# Patient Record
Sex: Male | Born: 1975
Health system: Southern US, Community
[De-identification: ages and names within clinical notes are randomized; demographics above are authoritative.]

---

## 2010-01-20 ENCOUNTER — Emergency Department (HOSPITAL_COMMUNITY): Admission: EM | Admit: 2010-01-20 | Discharge: 2010-01-20 | Payer: Self-pay | Admitting: Emergency Medicine

## 2010-05-31 ENCOUNTER — Ambulatory Visit: Payer: Self-pay | Admitting: Internal Medicine

## 2010-07-05 ENCOUNTER — Encounter (INDEPENDENT_AMBULATORY_CARE_PROVIDER_SITE_OTHER): Payer: Self-pay | Admitting: Internal Medicine

## 2010-07-05 LAB — CONVERTED CEMR LAB
ALT: 11 units/L (ref 0–53)
ANA Titer 1: 1:640 {titer} — ABNORMAL HIGH
AST: 21 units/L (ref 0–37)
Alkaline Phosphatase: 57 units/L (ref 39–117)
Creatinine, Ser: 0.95 mg/dL (ref 0.40–1.50)
Glucose, Bld: 73 mg/dL (ref 70–99)
Total Bilirubin: 0.6 mg/dL (ref 0.3–1.2)

## 2010-08-04 ENCOUNTER — Encounter: Payer: Self-pay | Admitting: Internal Medicine

## 2010-08-04 LAB — CONVERTED CEMR LAB
ANA Titer 1: 1:2560 {titer} — ABNORMAL HIGH
Basophils Relative: 2 % — ABNORMAL HIGH (ref 0–1)
Bilirubin Urine: NEGATIVE
ENA SM Ab Ser-aCnc: 5 (ref <30)
Eosinophils Absolute: 0.4 10*3/uL (ref 0.0–0.7)
Eosinophils Relative: 5 % (ref 0–5)
HCT: 42.5 % (ref 39.0–52.0)
Hemoglobin: 14 g/dL (ref 13.0–17.0)
Ketones, ur: NEGATIVE mg/dL
Lymphocytes Relative: 30 % (ref 12–46)
MCV: 81.1 fL (ref 78.0–100.0)
Monocytes Relative: 14 % — ABNORMAL HIGH (ref 3–12)
Neutro Abs: 3.5 10*3/uL (ref 1.7–7.7)
Nitrite: NEGATIVE
RBC: 5.24 M/uL (ref 4.22–5.81)
Specific Gravity, Urine: 1.008 (ref 1.005–1.030)
Squamous Epithelial / LPF: NONE SEEN /lpf
Urine Glucose: NEGATIVE mg/dL
pH: 7 (ref 5.0–8.0)

## 2010-09-19 ENCOUNTER — Encounter
Admission: RE | Admit: 2010-09-19 | Discharge: 2010-09-19 | Payer: Self-pay | Source: Home / Self Care | Attending: Family Medicine | Admitting: Family Medicine

## 2010-10-11 ENCOUNTER — Emergency Department (HOSPITAL_COMMUNITY)
Admission: EM | Admit: 2010-10-11 | Discharge: 2010-10-11 | Disposition: A | Discharge status: Home / Self Care | Payer: Worker's Compensation | Attending: Emergency Medicine | Admitting: Emergency Medicine

## 2010-10-11 ENCOUNTER — Emergency Department (HOSPITAL_COMMUNITY): Payer: Worker's Compensation

## 2010-10-11 DIAGNOSIS — S6000XA Contusion of unspecified finger without damage to nail, initial encounter: Secondary | ICD-10-CM | POA: Insufficient documentation

## 2010-10-11 DIAGNOSIS — Y929 Unspecified place or not applicable: Secondary | ICD-10-CM | POA: Insufficient documentation

## 2010-10-11 DIAGNOSIS — M7989 Other specified soft tissue disorders: Secondary | ICD-10-CM | POA: Insufficient documentation

## 2010-10-11 DIAGNOSIS — S62639A Displaced fracture of distal phalanx of unspecified finger, initial encounter for closed fracture: Secondary | ICD-10-CM | POA: Insufficient documentation

## 2010-10-11 DIAGNOSIS — M79609 Pain in unspecified limb: Secondary | ICD-10-CM | POA: Insufficient documentation

## 2010-10-11 DIAGNOSIS — W230XXA Caught, crushed, jammed, or pinched between moving objects, initial encounter: Secondary | ICD-10-CM | POA: Insufficient documentation

## 2012-07-31 IMAGING — CR DG FINGER THUMB 2+V*L*
4 series · 4 of 4 positions shown · non-contrast
Comparison: None.

CLINICAL DATA: Crush injury to distal thumb

LEFT THUMB 2+V

[x finger pa left *]
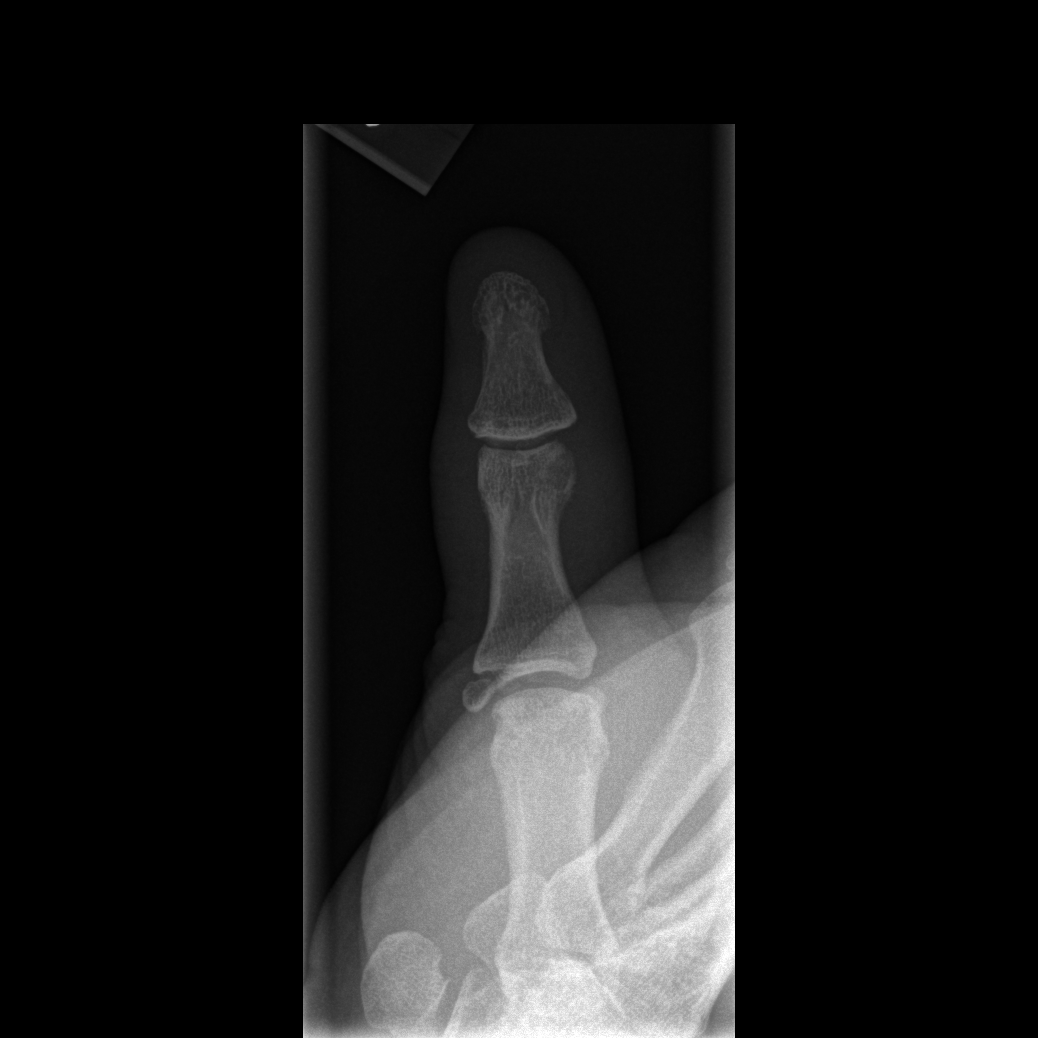

[x finger pa left]
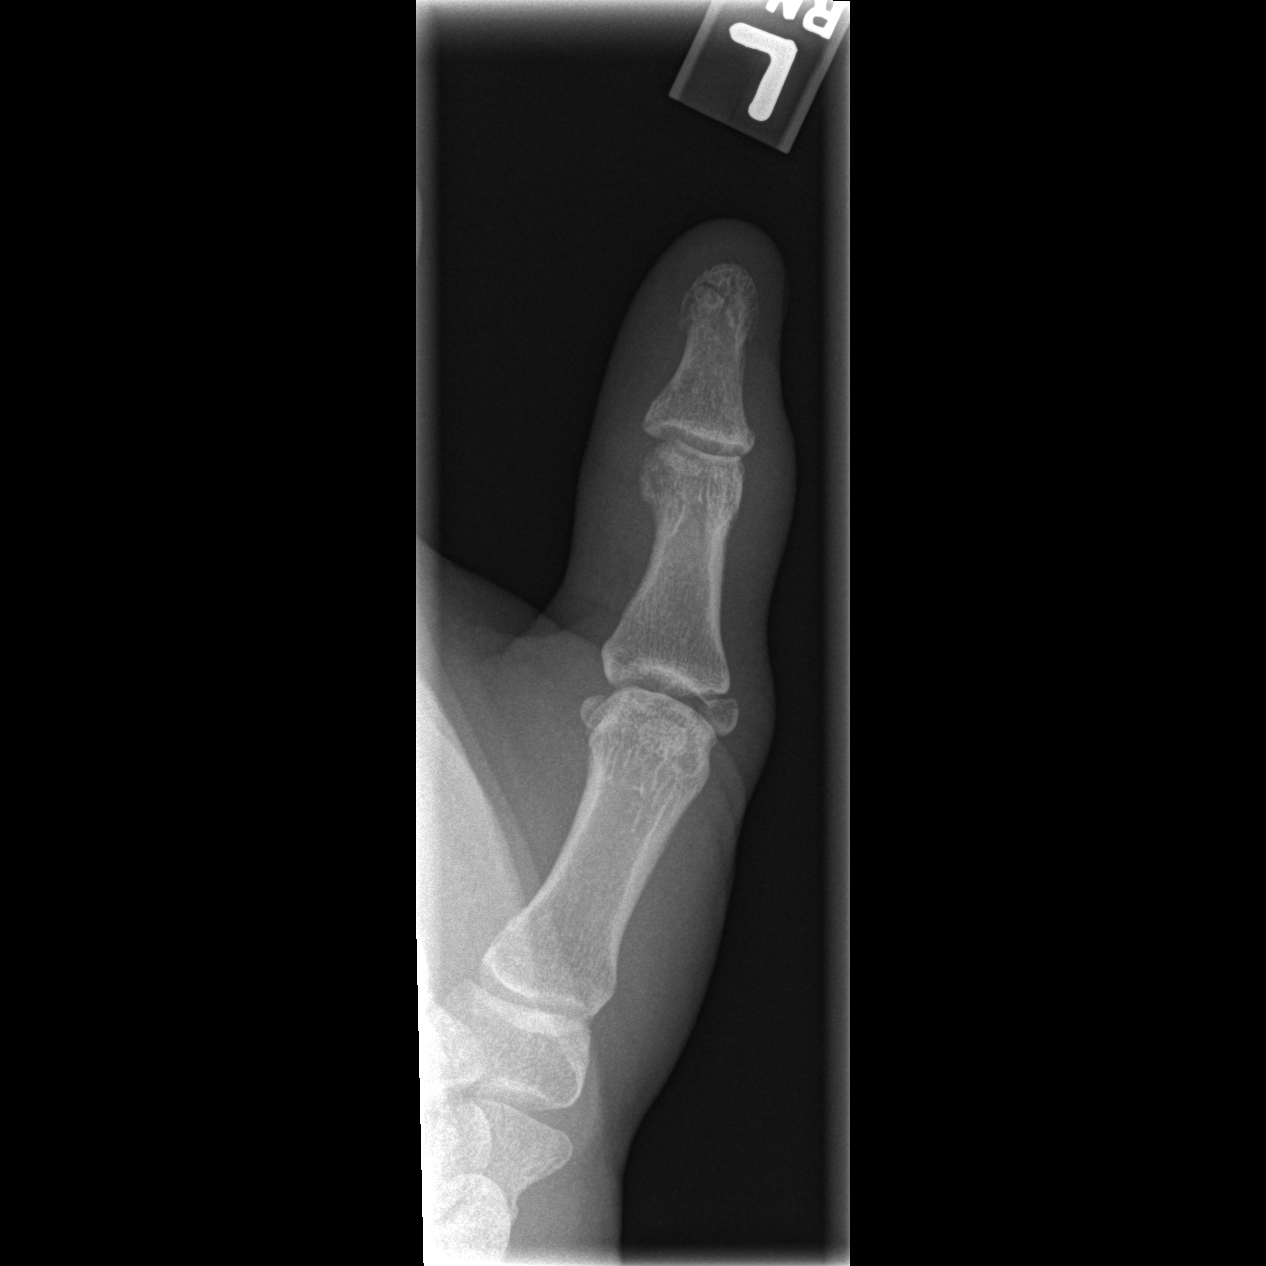

[x finger obl. left]
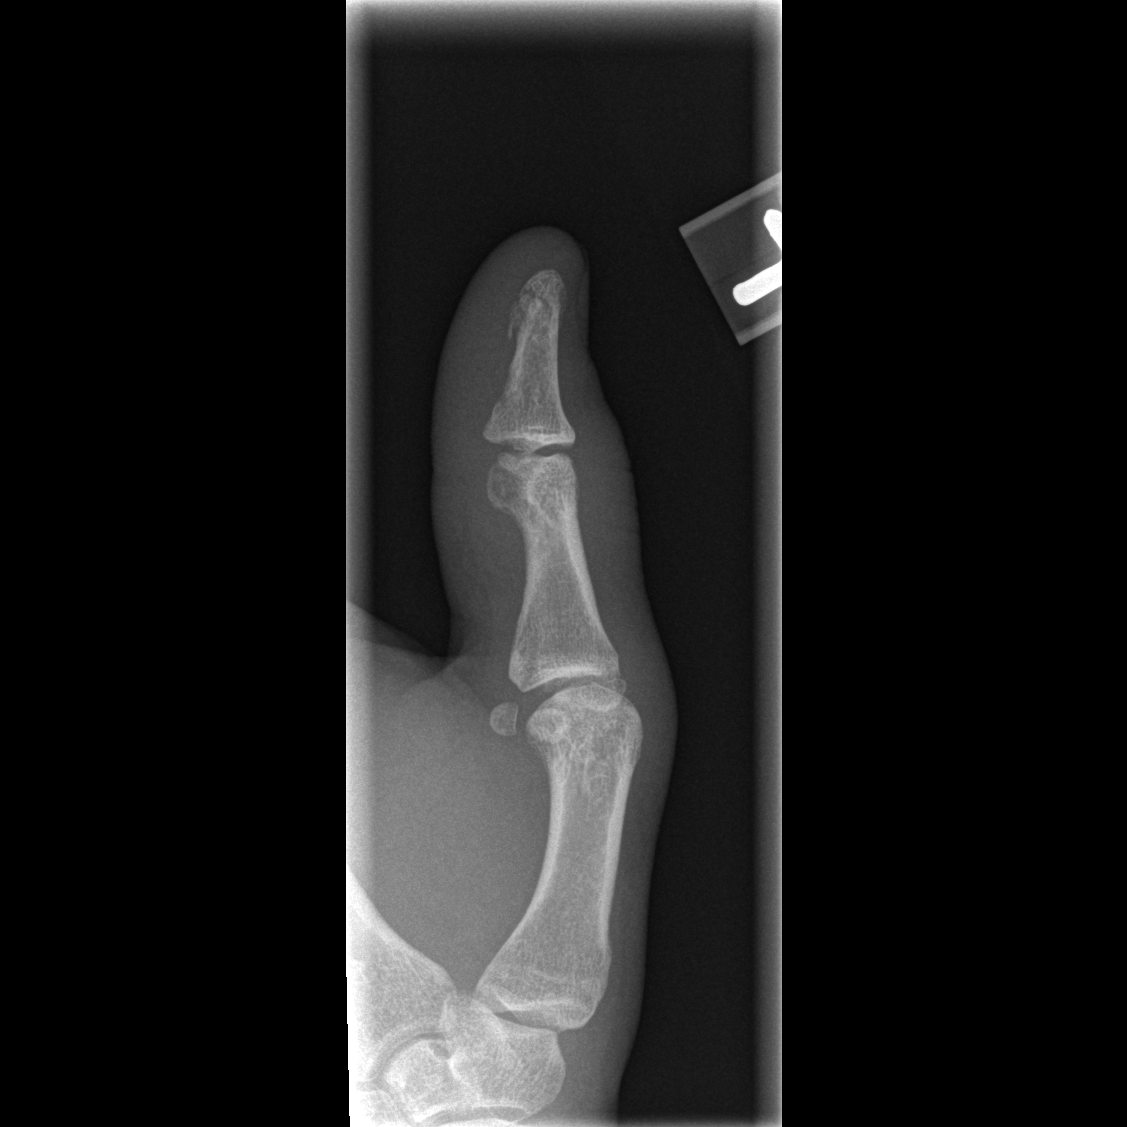

[x finger lateral left]
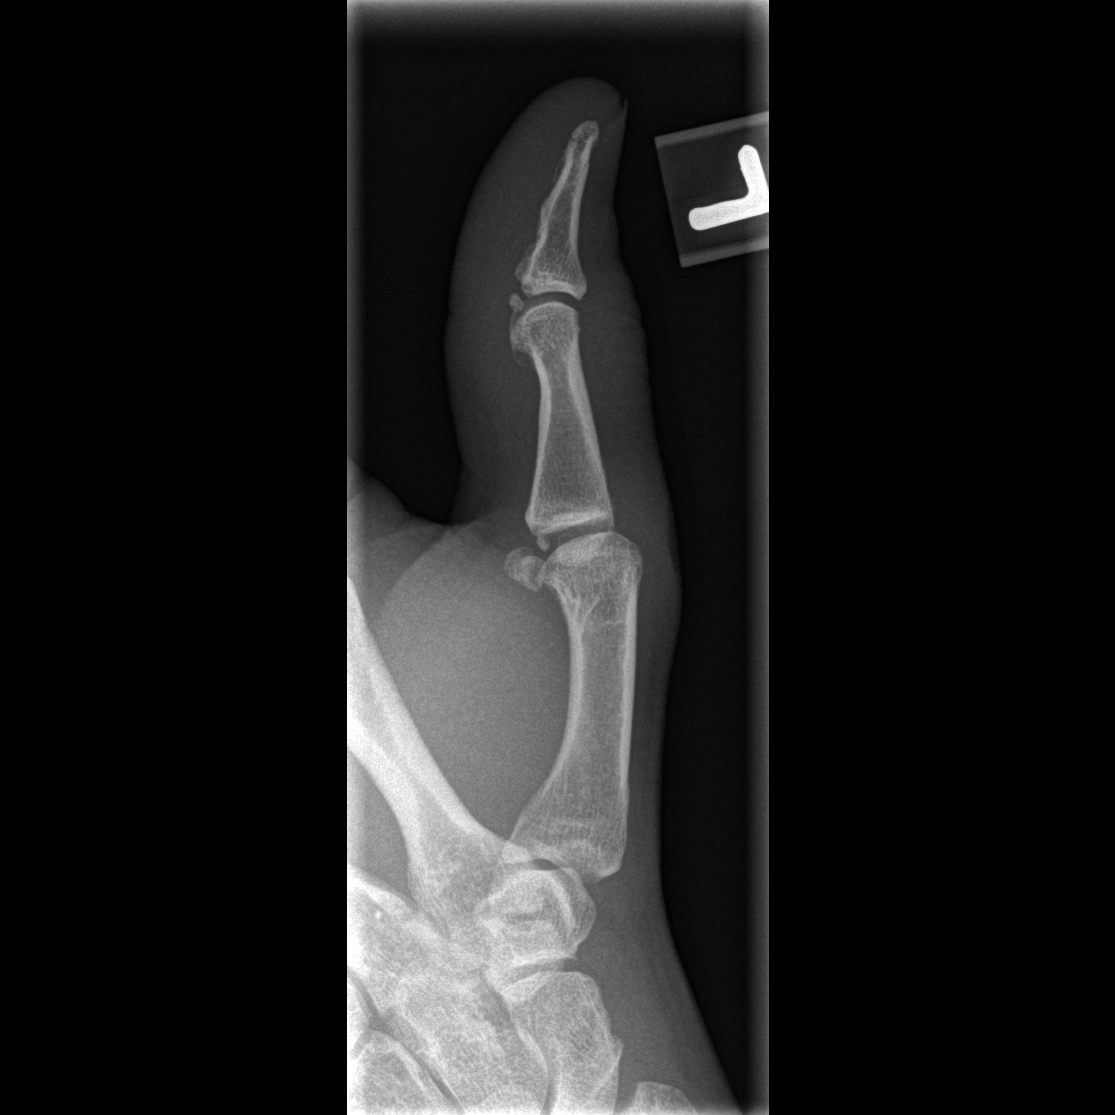

[4 of 4 positions shown; findings below may reference images not displayed]

FINDINGS: There is a nondisplaced fracture of the terminal tuft of
the distal phalanx.  There is soft tissue swelling around the
interphalangeal joint.  There is an accessory ossicle emanating off
the base of the proximal phalanx that simulates a fracture.
IMPRESSION: Terminal tuft fracture.

## 2012-11-19 ENCOUNTER — Emergency Department (HOSPITAL_COMMUNITY)
Admission: EM | Admit: 2012-11-19 | Discharge: 2012-11-19 | Disposition: A | Discharge status: Home / Self Care | Payer: Self-pay | Attending: Emergency Medicine | Admitting: Emergency Medicine

## 2012-11-19 ENCOUNTER — Encounter (HOSPITAL_COMMUNITY): Payer: Self-pay | Admitting: Emergency Medicine

## 2012-11-19 DIAGNOSIS — M083 Juvenile rheumatoid polyarthritis (seronegative): Secondary | ICD-10-CM | POA: Insufficient documentation

## 2012-11-19 DIAGNOSIS — M130 Polyarthritis, unspecified: Secondary | ICD-10-CM

## 2012-11-19 DIAGNOSIS — F17200 Nicotine dependence, unspecified, uncomplicated: Secondary | ICD-10-CM | POA: Insufficient documentation

## 2012-11-19 DIAGNOSIS — E049 Nontoxic goiter, unspecified: Secondary | ICD-10-CM | POA: Insufficient documentation

## 2012-11-19 DIAGNOSIS — N390 Urinary tract infection, site not specified: Secondary | ICD-10-CM | POA: Insufficient documentation

## 2012-11-19 LAB — SEDIMENTATION RATE: Sed Rate: 32 mm/hr — ABNORMAL HIGH (ref 0–16)

## 2012-11-19 LAB — BASIC METABOLIC PANEL
BUN: 6 mg/dL (ref 6–23)
Calcium: 9.4 mg/dL (ref 8.4–10.5)
Creatinine, Ser: 0.87 mg/dL (ref 0.50–1.35)
Glucose, Bld: 105 mg/dL — ABNORMAL HIGH (ref 70–99)

## 2012-11-19 LAB — URINE MICROSCOPIC-ADD ON

## 2012-11-19 LAB — URINALYSIS, ROUTINE W REFLEX MICROSCOPIC
Ketones, ur: NEGATIVE mg/dL
Nitrite: NEGATIVE
Protein, ur: NEGATIVE mg/dL
Urobilinogen, UA: 1 mg/dL (ref 0.0–1.0)

## 2012-11-19 LAB — CBC WITH DIFFERENTIAL/PLATELET
Basophils Absolute: 0.1 10*3/uL (ref 0.0–0.1)
Eosinophils Absolute: 0.3 10*3/uL (ref 0.0–0.7)
Eosinophils Relative: 4 % (ref 0–5)
Lymphocytes Relative: 24 % (ref 12–46)
Lymphs Abs: 2 10*3/uL (ref 0.7–4.0)
MCV: 77.1 fL — ABNORMAL LOW (ref 78.0–100.0)
Platelets: 321 10*3/uL (ref 150–400)
RDW: 13.6 % (ref 11.5–15.5)

## 2012-11-19 MED ORDER — SULFAMETHOXAZOLE-TMP DS 800-160 MG PO TABS
1.0000 | ORAL_TABLET | Freq: Once | ORAL | Status: AC
  Administered 2012-11-19: 1 via ORAL
  Filled 2012-11-19: qty 1

## 2012-11-19 MED ORDER — SULFAMETHOXAZOLE-TRIMETHOPRIM 800-160 MG PO TABS: 1.0000 | ORAL_TABLET | Freq: Two times a day (BID) | ORAL | Status: DC

## 2012-11-19 MED ORDER — HYDROCODONE-ACETAMINOPHEN 5-325 MG PO TABS: 1.0000 | ORAL_TABLET | Freq: Three times a day (TID) | ORAL | Status: DC | PRN

## 2012-11-19 MED ORDER — HYDROCODONE-ACETAMINOPHEN 5-325 MG PO TABS
1.0000 | ORAL_TABLET | Freq: Once | ORAL | Status: AC
  Administered 2012-11-19: 1 via ORAL
  Filled 2012-11-19: qty 1

## 2012-11-19 NOTE — ED Notes (Signed)
Pt given discharge paperwork.  Pt verbalized understanding of d/c and f/u.  No additional questions by pt.  VSS. Resps e/u.  E-signature obtained.

## 2012-11-19 NOTE — ED Provider Notes (Signed)
History     CSN: [id number]  Arrival date & time 11/19/12  0415   First MD Initiated Contact with Patient 11/19/12 [phone]      Chief Complaint  Patient presents with  . Hand Pain    (Consider location/radiation/quality/duration/timing/severity/associated sxs/prior treatment) HPI  A 37 year old male presents complaining of right hand pain and swelling. Patient reports for over a year he has noticed that both of his hand has progressively increased in size. 2 weeks ago he was having throbbing sharp pain to his left hand which lasted 1 day and resolved on its own. For the past 2-3 days he has had the same throbbing sharp pain to his right hand with increased swelling. Pain is been persistent, 10 out of 10, nonradiating, with tingling sensation. He is having trouble grabbing items due to the discomfort. He denies any specific injury. He denies fever, chills, or rash. 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  Does not have family hx of autoimmune disease, as far as he is aware.    History reviewed. No pertinent past medical history.  History reviewed. No pertinent past surgical history.  No family history on file.  History  Substance Use Topics  . Smoking status: Current Every Day Smoker  . Smokeless tobacco: Not on file  . Alcohol Use: Yes      Review of Systems  Constitutional: Negative for fever.  Musculoskeletal: Positive for joint swelling and arthralgias.  Skin: Negative for rash and wound.  Neurological: Negative for numbness.    Allergies  Review of patient's allergies indicates no known allergies.  Home Medications  No current outpatient prescriptions on file.  BP 115/73  Pulse 61  Temp(Src) 97.3 F (36.3 C) (Oral)  Resp 14  SpO2 100%  Physical Exam  Nursing note and vitals reviewed. Constitutional: He appears well-developed and well-nourished. No distress.  HENT:  Head: Atraumatic.  Eyes: Conjunctivae and EOM are normal.  Pupils are equal, round, and reactive to light. Right eye exhibits no discharge. Left eye exhibits no discharge. No scleral icterus.  Neck: Thyromegaly present.  Cardiovascular: Normal rate and regular rhythm.   Pulmonary/Chest: Effort normal and breath sounds normal. He has no wheezes.  Abdominal: Soft.  Musculoskeletal: He exhibits tenderness (bilaterally hands appears enlarge, with hypertrophic joints most significant to PIP joints in all fingers.  generalized tenderness on palpation.  No abscess, no rash, brisk cap refill.  unable to make a fist in either hand. No crepitus. radial pulses 2+).  Neurological: He is alert.  Skin: Skin is warm.    ED Course  Procedures (including critical care time)  6:42 AM Pt with bilateral hand pain, R>L with evidence of hypertrophy to all finger joints suspicious for autoimmune disease such as RA.  No prior history of gonorrhea to suggest gonococcal polyarthritis.  Doubt gout.  No rash to suggest systemic lupus erythematosus. Sxs does not suggest reactive arthritis. Vicodin given for pain. Care discussed with my attending who recommend checking CBC, BMP, sedimentation rate, an RPR. Patient would likely benefit from outpatient evaluation. No obvious signs to suggest infection at this time.  8:39 AM Pt also reports occasional joint pain to his knees and elbows, intermittent for the past year.  I suspect this all correlated to his hand pain.  I discuss the importance of f/u with PCP using our resources, pt voice understanding.    9:51 AM Sed rate elevated at 38.  Pt has normal WBC, normal electrolytes, normal potassium.  However his UA is with WBC 21-50 with moderate leukocytes, nitrite positive. . Pt denies dysuria or penile discharge.  Urine culture sent. Will also collect GC/Ch culture from urine. Will treat him for suspect UTI with septra.    Labs Reviewed  CBC WITH DIFFERENTIAL - Abnormal; Notable for the following:    HCT 36.7 (*)    MCV 77.1 (*)     Monocytes Relative 13 (*)    Monocytes Absolute 1.1 (*)    All other components within normal limits  BASIC METABOLIC PANEL - Abnormal; Notable for the following:    Glucose, Bld 105 (*)    All other components within normal limits  SEDIMENTATION RATE - Abnormal; Notable for the following:    Sed Rate 32 (*)    All other components within normal limits  URINALYSIS, ROUTINE W REFLEX MICROSCOPIC - Abnormal; Notable for the following:    Leukocytes, UA MODERATE (*)    All other components within normal limits  URINE MICROSCOPIC-ADD ON - Abnormal; Notable for the following:    Squamous Epithelial / LPF FEW (*)    Bacteria, UA FEW (*)    All other components within normal limits  URINE CULTURE  GC/CHLAMYDIA PROBE AMP  RPR   No results found.   1. Polyarticular arthritis   2. UTI (lower urinary tract infection)       MDM  BP 123/69  Pulse 53  Temp(Src) 97.3 F (36.3 C) (Oral)  Resp 14  SpO2 100%  I have reviewed nursing notes and vital signs.  I reviewed available ER/hospitalization records thought the EMR\        Fayrene Helper, PA-C 11/19/12 1117

## 2012-11-19 NOTE — ED Notes (Signed)
Report to Natalie, RN

## 2012-11-19 NOTE — ED Notes (Signed)
Report from Bobby, RN 

## 2012-11-19 NOTE — ED Provider Notes (Signed)
Medical screening examination/treatment/procedure(s) were performed by non-physician practitioner and as supervising physician I was immediately available for consultation/collaboration.   Shelda Jakes, MD 11/19/12 2216

## 2012-11-19 NOTE — ED Notes (Signed)
PT. REPORTS PROGRESSING RIGHT HAND PAIN WITH SWELLING ONSET LAST Sunday , DENIES INJURY .

## 2012-11-20 LAB — URINE CULTURE: Culture: NO GROWTH

## 2013-01-05 ENCOUNTER — Emergency Department (HOSPITAL_COMMUNITY)
Admission: EM | Admit: 2013-01-05 | Discharge: 2013-01-05 | Disposition: A | Discharge status: Home / Self Care | Payer: Self-pay | Attending: Emergency Medicine | Admitting: Emergency Medicine

## 2013-01-05 ENCOUNTER — Encounter (HOSPITAL_COMMUNITY): Payer: Self-pay | Admitting: *Deleted

## 2013-01-05 DIAGNOSIS — F17200 Nicotine dependence, unspecified, uncomplicated: Secondary | ICD-10-CM | POA: Insufficient documentation

## 2013-01-05 DIAGNOSIS — M130 Polyarthritis, unspecified: Secondary | ICD-10-CM | POA: Insufficient documentation

## 2013-01-05 MED ORDER — PREDNISONE 10 MG PO TABS: ORAL_TABLET | ORAL | Status: DC

## 2013-01-05 MED ORDER — HYDROCODONE-ACETAMINOPHEN 5-325 MG PO TABS: 1.0000 | ORAL_TABLET | Freq: Four times a day (QID) | ORAL | Status: DC | PRN

## 2013-01-05 MED ORDER — IBUPROFEN 600 MG PO TABS: 600.0000 mg | ORAL_TABLET | Freq: Four times a day (QID) | ORAL | Status: DC | PRN

## 2013-01-05 MED ORDER — PREDNISONE 20 MG PO TABS
60.0000 mg | ORAL_TABLET | Freq: Once | ORAL | Status: AC
  Administered 2013-01-05: 60 mg via ORAL
  Filled 2013-01-05: qty 3

## 2013-01-05 NOTE — ED Provider Notes (Signed)
History    This chart was scribed for Jaynie Crumble, non-physician practitioner working with Carleene Cooper III, MD by Leone Payor, ED Scribe. This patient was seen in room TR10C/TR10C and the patient's care was started at 1500.   CSN: [id number]  Arrival date & time 01/05/13  1500   First MD Initiated Contact with Patient 01/05/13 1520      Chief Complaint  Patient presents with  . Joint Swelling    The history is provided by the patient. No language interpreter was used.    HPI Comments: Brian Chambers is a 37 y.o. male who presents to the Emergency Department complaining of constant, unchanged pain and swelling to bilateral hands, elbows, knees starting yesterday. Pt states this is a recurring problem and initially started about 2 years ago. Pt states he takes Advil with mild relief. He denies fever. Pt is a current everyday smoker and occasional alcohol user.   History reviewed. No pertinent past medical history.  History reviewed. No pertinent past surgical history.  History reviewed. No pertinent family history.  History  Substance Use Topics  . Smoking status: Current Every Day Smoker  . Smokeless tobacco: Not on file  . Alcohol Use: Yes      Review of Systems  Constitutional: Negative for fever.  Musculoskeletal: Positive for joint swelling.    Allergies  Review of patient's allergies indicates no known allergies.  Home Medications   Current Outpatient Rx  Name  Route  Sig  Dispense  Refill  . HYDROcodone-acetaminophen (NORCO/VICODIN) 5-325 MG per tablet   Oral   Take 1 tablet by mouth every 8 (eight) hours as needed for pain.   20 tablet   0   . sulfamethoxazole-trimethoprim (SEPTRA DS) 800-160 MG per tablet   Oral   Take 1 tablet by mouth every 12 (twelve) hours.   10 tablet   0     BP 118/72  Pulse 60  Temp(Src) 97.8 F (36.6 C) (Oral)  Resp 18  SpO2 99%  Physical Exam  Nursing note and vitals reviewed. Constitutional: He is oriented  to person, place, and time. He appears well-developed and well-nourished. No distress.  HENT:  Head: Normocephalic and atraumatic.  Eyes: EOM are normal.  Neck: Neck supple. No tracheal deviation present.  Cardiovascular: Normal rate.   Pulmonary/Chest: Effort normal. No respiratory distress.  Musculoskeletal: Normal range of motion.  Swelling of his MCP, PIP, DIP joints over bilateral hands. Swelling to the L elbow joint and bilateral knee joints. Mild tenderness  to palpation of joints, no warmth or redness. Limited ROM due to swelling in all of his joints.   Neurological: He is alert and oriented to person, place, and time.  Skin: Skin is warm and dry.  Psychiatric: He has a normal mood and affect. His behavior is normal.    ED Course  Procedures (including critical care time)  DIAGNOSTIC STUDIES: Oxygen Saturation is 99% on room air, normal by my interpretation.    COORDINATION OF CARE: 3:41 PM Discussed treatment plan with pt at bedside and pt agreed to plan.   Labs Reviewed - No data to display No results found.   1. Chronic polyarthritis       MDM  PT with swelling in multiple joints for several years now. States swelling comes and goes. Has been seen here few times for the same, once last month. Elevated sed rate at that time. Joints do not appear to have infection, not warm, red. Very mild tenderness to  palpation. Pt afebrile. Suspect autoimmune process. He is non toxic, no  Distress. Will start on short prednisone taper, NSAIDs, pain medication at home. Pt explained he needs to follow up with PCP for further blood tests and evaluation to figure out what is causing it. Pt voiced understanding.   Filed Vitals:   01/05/13 1512  BP: 118/72  Pulse: 60  Temp: 97.8 F (36.6 C)  TempSrc: Oral  Resp: 18  SpO2: 99%    I personally performed the services described in this documentation, which was scribed in my presence. The recorded information has been reviewed and is  accurate.   Lottie Mussel, PA-C 01/05/13 1558

## 2013-01-05 NOTE — ED Notes (Signed)
Pt reports onset yesterday of swelling to all his fingers, reports starting to have swelling to elbows and knees. Ambulatory at triage, no acute distress noted.

## 2013-01-06 NOTE — ED Provider Notes (Signed)
Medical screening examination/treatment/procedure(s) were performed by non-physician practitioner and as supervising physician I was immediately available for consultation/collaboration.   Rodolphe Edmonston III, MD 01/06/13 1257 

## 2016-04-03 ENCOUNTER — Encounter (HOSPITAL_COMMUNITY): Payer: Self-pay | Admitting: *Deleted

## 2016-04-03 ENCOUNTER — Emergency Department (HOSPITAL_COMMUNITY)
Admission: EM | Admit: 2016-04-03 | Discharge: 2016-04-03 | Disposition: A | Discharge status: Home / Self Care | Payer: Worker's Compensation | Attending: Emergency Medicine | Admitting: Emergency Medicine

## 2016-04-03 DIAGNOSIS — F17200 Nicotine dependence, unspecified, uncomplicated: Secondary | ICD-10-CM | POA: Insufficient documentation

## 2016-04-03 DIAGNOSIS — M2550 Pain in unspecified joint: Secondary | ICD-10-CM | POA: Insufficient documentation

## 2016-04-03 MED ORDER — TRAMADOL HCL 50 MG PO TABS: 50.0000 mg | ORAL_TABLET | Freq: Four times a day (QID) | ORAL | 0 refills | Status: DC | PRN

## 2016-04-03 MED ORDER — IBUPROFEN 600 MG PO TABS: 600.0000 mg | ORAL_TABLET | Freq: Four times a day (QID) | ORAL | 0 refills | Status: DC | PRN

## 2016-04-03 MED FILL — traMADol HCL 50 MG TABS: 50 | 7 days supply | Qty: 15 | Fill #0

## 2016-04-03 NOTE — ED Notes (Signed)
Declined W/C at D/C and was escorted to lobby by RN. 

## 2016-04-03 NOTE — ED Provider Notes (Signed)
[facility] DEPT Provider Note   CSN: [id number] Arrival date & time: 04/03/16  0945  By signing my name below, I, Judithe Modest, attest that this documentation has been prepared under the direction and in the presence of Glendell Docker, CNP. Electronically Signed: Judithe Modest, ER Scribe. 04/01/2016. 4:20 PM.   First MD Initiated Contact with Patient 04/03/16 1041    History   Chief Complaint Chief Complaint  Patient presents with  . Joint Pain   HPI  HPI Comments: Brian Chambers is a 40 y.o. male who presents to the Emergency Department complaining of intermittent whole body arthralgias for the last year.he has not had fever. Denies weakness or numbness. Hasn't taken any medications for it. It has become significantly worse recently. He does not have a PCP. He has not seen an orthopedist.   History reviewed. No pertinent past medical history.  There are no active problems to display for this patient.   History reviewed. No pertinent surgical history.    Home Medications    Prior to Admission medications   Medication Sig Start Date End Date Taking? Authorizing Provider  HYDROcodone-acetaminophen (NORCO) 5-325 MG per tablet Take 1 tablet by mouth every 6 (six) hours as needed for pain. 01/05/13   Tatyana Kirichenko, PA-C  ibuprofen (ADVIL,MOTRIN) 200 MG tablet Take 400 mg by mouth every 6 (six) hours as needed for pain.    Historical Provider, MD  ibuprofen (ADVIL,MOTRIN) 600 MG tablet Take 1 tablet (600 mg total) by mouth every 6 (six) hours as needed for pain. 01/05/13   Tatyana Kirichenko, PA-C  predniSONE (DELTASONE) 10 MG tablet Take 5 tab day 1, take 4 tab day 2, take 3 tab day 3, take 2 tab day 4, and take 1 tab day 5 01/05/13   Jeannett Senior, PA-C    Family History History reviewed. No pertinent family history.  Social History Social History  Substance Use Topics  . Smoking status: Current Every Day Smoker  . Smokeless tobacco: Not on file  .  Alcohol use Yes     Allergies   Review of patient's allergies indicates no known allergies.   Review of Systems Review of Systems  Constitutional: Negative for chills and fever.  Musculoskeletal: Positive for arthralgias, gait problem and joint swelling.  Skin: Negative for color change and wound.  Neurological: Negative for weakness and numbness.  All other systems reviewed and are negative.   Physical Exam Updated Vital Signs BP 127/80 (BP Location: Right Arm)   Pulse 86   Temp 98.1 F (36.7 C) (Oral)   Resp 18   SpO2 100%   Physical Exam  Constitutional: He is oriented to person, place, and time. He appears well-developed and well-nourished. No distress.  HENT:  Head: Normocephalic and atraumatic.  Eyes: Pupils are equal, round, and reactive to light.  Neck: Neck supple.  Cardiovascular: Normal rate.   Pulmonary/Chest: Effort normal. No respiratory distress.  Musculoskeletal: Normal range of motion.  No swelling or redness noted to joints. Full rom. Good strength and sensation  Neurological: He is alert and oriented to person, place, and time. Coordination normal.  Skin: Skin is warm and dry. He is not diaphoretic.  Psychiatric: He has a normal mood and affect. His behavior is normal.  Nursing note and vitals reviewed.   ED Treatments / Results  DIAGNOSTIC STUDIES: Oxygen Saturation is 100% on RA, normal by my interpretation.    COORDINATION OF CARE: 10:47 AM Discussed treatment plan with pt at bedside which  includes orthopedics referral and pt agreed to plan.  Labs (all labs ordered are listed, but only abnormal results are displayed) Labs Reviewed - No data to display  EKG  EKG Interpretation None       Radiology No results found.  Procedures Procedures (including critical care time)  Medications Ordered in ED Medications - No data to display   Initial Impression / Assessment and Plan / ED Course  I have reviewed the triage vital signs and  the nursing notes.  Pertinent labs & imaging results that were available during my care of the patient were reviewed by me and considered in my medical decision making (see chart for details).  Clinical Course    Arthritis  Pt present with aching pain & joint stiffness worsened in the morning. On exam joints were tender with limited ROM, but no current medical concern for septic arthritis or joint as pt is afebrile & without warmth of the affected area.   Presentation c/w dx of OA. Pain managed in ED. Pt advised to follow up with orthopedics if symptoms persist for further evaluation if conservative therapies do not work.  Return precaurtions discussed. Patient will be dc home & is agreeable with above plan.  Final Clinical Impressions(s) / ED Diagnoses   Final diagnoses:  None    New Prescriptions New Prescriptions   No medications on file     I personally performed the services described in this documentation, which was scribed in my presence. The recorded information has been reviewed and is accurate.     Glendell Docker, NP 04/03/16 [place]    Virgel Manifold, MD 04/07/16 1130

## 2016-04-03 NOTE — ED Triage Notes (Signed)
Pt reports joint pain x 1 year.

## 2016-04-05 ENCOUNTER — Ambulatory Visit: Payer: Self-pay | Attending: Internal Medicine | Admitting: Physician Assistant

## 2016-04-05 ENCOUNTER — Encounter: Payer: Self-pay | Admitting: Physician Assistant

## 2016-04-05 VITALS — BP 107/70 | HR 78 | Temp 98.5°F | Wt 130.6 lb

## 2016-04-05 DIAGNOSIS — R634 Abnormal weight loss: Secondary | ICD-10-CM

## 2016-04-05 DIAGNOSIS — M1990 Unspecified osteoarthritis, unspecified site: Secondary | ICD-10-CM | POA: Insufficient documentation

## 2016-04-05 DIAGNOSIS — F17210 Nicotine dependence, cigarettes, uncomplicated: Secondary | ICD-10-CM | POA: Insufficient documentation

## 2016-04-05 LAB — BASIC METABOLIC PANEL
BUN: 10 mg/dL (ref 7–25)
CALCIUM: 9.3 mg/dL (ref 8.6–10.3)
CO2: 26 mmol/L (ref 20–31)
CREATININE: 0.94 mg/dL (ref 0.60–1.35)
Chloride: 103 mmol/L (ref 98–110)
GLUCOSE: 78 mg/dL (ref 65–99)
Potassium: 3.9 mmol/L (ref 3.5–5.3)
SODIUM: 137 mmol/L (ref 135–146)

## 2016-04-05 LAB — CBC WITH DIFFERENTIAL/PLATELET
BASOS ABS: 105 {cells}/uL (ref 0–200)
Basophils Relative: 1 %
EOS PCT: 3 %
Eosinophils Absolute: 315 cells/uL (ref 15–500)
HCT: 32.5 % — ABNORMAL LOW (ref 38.5–50.0)
HEMOGLOBIN: 10.6 g/dL — AB (ref 13.2–17.1)
LYMPHS ABS: 2625 {cells}/uL (ref 850–3900)
LYMPHS PCT: 25 %
MCH: 25.9 pg — AB (ref 27.0–33.0)
MCHC: 32.6 g/dL (ref 32.0–36.0)
MCV: 79.3 fL — ABNORMAL LOW (ref 80.0–100.0)
MONOS PCT: 14 %
MPV: 10.1 fL (ref 7.5–12.5)
Monocytes Absolute: 1470 cells/uL — ABNORMAL HIGH (ref 200–950)
NEUTROS PCT: 57 %
Neutro Abs: 5985 cells/uL (ref 1500–7800)
Platelets: 506 10*3/uL — ABNORMAL HIGH (ref 140–400)
RBC: 4.1 MIL/uL — AB (ref 4.20–5.80)
RDW: 15.5 % — AB (ref 11.0–15.0)
WBC: 10.5 10*3/uL (ref 3.8–10.8)

## 2016-04-05 LAB — TSH: TSH: 0.46 m[IU]/L (ref 0.40–4.50)

## 2016-04-05 LAB — RHEUMATOID FACTOR

## 2016-04-05 NOTE — Progress Notes (Signed)
Pt states he is here today for the following  ED follow up for Polyarthralgia Right groin pain - swollen lymph node x 1 mth

## 2016-04-05 NOTE — Progress Notes (Signed)
Brian Chambers  [id number]  [id number]  DOB - 1975/08/25  Chief Complaint  Patient presents with  . Follow-up    ED - Polyarthralgia       Subjective:   Brian Chambers is a 40 y.o. male here today for establishment of care. He has no significant past medical history. He presented to the emergency room on 04/03/2016 with complaints of full body aches. His symptoms have been intermittent for years. He recently had a son associated diarrhea was time for him to go and be evaluated. His aches are in a minute. They're all over. Mostly joint. No fever or chills. He has not been taking any over-the-counter medications. He does smoke.  His vital signs are stable. No labs or imaging studies were done. He was diagnosed with osteoarthritis. He was given a prescription for tramadol. He also was told to take over-the-counter anti-inflammatories.  He's been taking the tramadol with improvement of his symptoms. He has no specific complaints today. He did not take the anti-inflammatories. He does state he still has swelling of multiple joint areas. No fever or chills. He also admits to some weight loss.  ROS: GEN: denies fever or chills, +loss in weight Skin: denies lesions or rashes HEENT: denies headache, earache, epistaxis, sore throat, or neck pain LUNGS: denies SHOB, dyspnea, PND, orthopnea CV: denies CP or palpitations ABD: denies abd pain, N or V EXT: denies muscle spasms or swelling; + pain in lower ext, + weakness NEURO: denies numbness or tingling, denies sz, stroke or TIA  ALLERGIES: No Known Allergies  PAST MEDICAL HISTORY: No past medical history on file.  PAST SURGICAL HISTORY: No past surgical history on file.  MEDICATIONS AT HOME: Prior to Admission medications   Medication Sig Start Date End Date Taking? Authorizing Sofie Schendel  traMADol (ULTRAM) 50 MG tablet Take 1 tablet (50 mg total) by mouth every 6 (six) hours as needed. 04/03/16  Yes Glendell Docker, NP    HYDROcodone-acetaminophen (NORCO) 5-325 MG per tablet Take 1 tablet by mouth every 6 (six) hours as needed for pain. Patient not taking: Reported on 04/05/2016 01/05/13   Tatyana Kirichenko, PA-C  ibuprofen (ADVIL,MOTRIN) 600 MG tablet Take 1 tablet (600 mg total) by mouth every 6 (six) hours as needed. Patient not taking: Reported on 04/05/2016 04/03/16   Glendell Docker, NP  predniSONE (DELTASONE) 10 MG tablet Take 5 tab day 1, take 4 tab day 2, take 3 tab day 3, take 2 tab day 4, and take 1 tab day 5 Patient not taking: Reported on 04/05/2016 01/05/13   Tatyana Kirichenko, PA-C     Objective:   Vitals:   04/05/16 1126  BP: 107/70  Pulse: 78  Temp: 98.5 F (36.9 C)  TempSrc: Oral  Weight: 130 lb 9.6 oz (59.2 kg)    Exam General appearance : Awake, alert, not in any distress. Speech Clear. Not toxic looking HEENT: Atraumatic and Normocephalic, pupils equally reactive to light and accomodation Neck: supple, no JVD. No cervical lymphadenopathy.  Chest:Good air entry bilaterally, no added sounds  CVS: S1 S2 regular, no murmurs.  Abdomen: Bowel sounds present, Non tender and not distended with no gaurding, rigidity or rebound. Extremities: DIP joints swollen bilaterally, both legs are warm to touch, 2+ PP Neurology: Awake alert, and oriented X 3, CN II-XII intact, Non focal   Assessment & Plan  1. Osteoarthritis  -check CBC, BMP, ANA, ESR, RF  -Rheumatology referral if needed  -Cont Tramadol prn  -add Ibuprofen  2. Smoker  -  cessation discussed    Return in about 2 weeks (around 04/19/2016). for lab review and routine health maintenace.  The patient was given clear instructions to go to ER or return to medical center if symptoms don't improve, worsen or new problems develop. The patient verbalized understanding. The patient was told to call to get lab results if they haven't heard anything in the next week.   This note has been created with Designer, industrial/product. Any transcriptional errors are unintentional.    Zettie Pho, PA-C [facility] [place], [place]   04/05/2016, 11:44 AM

## 2016-04-06 LAB — SEDIMENTATION RATE: SED RATE: 61 mm/h — AB (ref 0–15)

## 2016-04-07 LAB — ANA, IFA COMPREHENSIVE PANEL
Anti Nuclear Antibody(ANA): POSITIVE — AB
ENA SM AB SER-ACNC: NEGATIVE
SCLERODERMA (SCL-70) (ENA) ANTIBODY, IGG: NEGATIVE
SM/RNP: POSITIVE — AB
SSA (Ro) (ENA) Antibody, IgG: <1
SSB (La) (ENA) Antibody, IgG: <1
ds DNA Ab: 1 IU/mL

## 2016-04-07 LAB — ANTI-NUCLEAR AB-TITER (ANA TITER)

## 2016-04-14 NOTE — Addendum Note (Signed)
Addended byZettie Pho S on: 04/14/2016 11:24 AM   Modules accepted: Orders

## 2016-04-17 ENCOUNTER — Telehealth: Payer: Self-pay | Admitting: General Practice

## 2016-04-17 ENCOUNTER — Telehealth: Payer: Self-pay

## 2016-04-17 NOTE — Telephone Encounter (Signed)
Pt. Called to schedule an appointment. Pt. Was scheduled for 04/28/16 and stated he is in pain. Pt. Would like a refill on Tramadol until his next OV. Please f/u with pt.

## 2016-04-17 NOTE — Telephone Encounter (Signed)
Returned call to patient; hipaa verified per guidelines.  Rn advised patient his ANA titer was elevated and he is being referred to rheumatologists for further evaluation.  Advised the elevation seen with RA, medications and etc...  Patient verbalized understanding. No further questions at this time.  Priscille Heidelberg, RN, BSN

## 2016-04-17 NOTE — Telephone Encounter (Signed)
Pt called office to speak with nurse regarding the conversation that he had with her earlier today. Pt didn't understand everything that was said to him. Please follow up.

## 2016-04-17 NOTE — Telephone Encounter (Signed)
Call placed to patient, Hipaa verified per guidelines. Rn advised patient per PA-C: some of the arthritis labs were abnormal and that we should refer him to a joint specialist? Placed a rheumatology referral.  Patient verbalized understanding.  Priscille Heidelberg, RN, BSN

## 2016-04-18 ENCOUNTER — Other Ambulatory Visit: Payer: Self-pay | Admitting: Internal Medicine

## 2016-04-18 MED ORDER — IBUPROFEN 600 MG PO TABS: 600.0000 mg | ORAL_TABLET | Freq: Four times a day (QID) | ORAL | 0 refills | Status: AC | PRN

## 2016-04-18 NOTE — Telephone Encounter (Signed)
IBUPROFEN HAS BEEN PRESCRIBED TO THE PHARMACY FOR PICK-UP. NO TRAMADOL OR NARCOTICS ALLOWED.

## 2016-04-21 NOTE — Telephone Encounter (Signed)
Medical Assistant left message on patient's home and cell voicemail. Voicemail states to give a call back to [place] with [facility] at [phone].  !!Patient may obtain ibuprofen from pharmacy to bridge until appointment. No other medications will be prescribed until patient has been seen!!!

## 2016-04-28 ENCOUNTER — Ambulatory Visit: Payer: Self-pay | Attending: Family Medicine | Admitting: Family Medicine

## 2016-04-28 ENCOUNTER — Encounter: Payer: Self-pay | Admitting: Family Medicine

## 2016-04-28 VITALS — BP 117/74 | HR 76 | Temp 97.8°F | Ht 73.0 in | Wt 129.6 lb

## 2016-04-28 DIAGNOSIS — R590 Localized enlarged lymph nodes: Secondary | ICD-10-CM | POA: Insufficient documentation

## 2016-04-28 DIAGNOSIS — R634 Abnormal weight loss: Secondary | ICD-10-CM | POA: Insufficient documentation

## 2016-04-28 DIAGNOSIS — F17200 Nicotine dependence, unspecified, uncomplicated: Secondary | ICD-10-CM | POA: Insufficient documentation

## 2016-04-28 DIAGNOSIS — R760 Raised antibody titer: Secondary | ICD-10-CM | POA: Insufficient documentation

## 2016-04-28 DIAGNOSIS — Z681 Body mass index (BMI) 19 or less, adult: Secondary | ICD-10-CM | POA: Insufficient documentation

## 2016-04-28 DIAGNOSIS — R768 Other specified abnormal immunological findings in serum: Secondary | ICD-10-CM

## 2016-04-28 DIAGNOSIS — M2550 Pain in unspecified joint: Secondary | ICD-10-CM | POA: Insufficient documentation

## 2016-04-28 LAB — RHEUMATOID FACTOR: Rhuematoid fact SerPl-aCnc: <10 IU/mL (ref <=14)

## 2016-04-28 MED ORDER — TRAMADOL HCL 50 MG PO TABS: 50.0000 mg | ORAL_TABLET | Freq: Two times a day (BID) | ORAL | 0 refills | Status: AC | PRN

## 2016-04-28 MED ORDER — PREDNISONE 5 MG PO TABS: 5.0000 mg | ORAL_TABLET | Freq: Every day | ORAL | 0 refills | Status: DC

## 2016-04-28 NOTE — Progress Notes (Signed)
Subjective:  Patient ID: Brian Chambers, male    DOB: May 07, 1976  Age: 40 y.o. MRN: [id number]  CC: Osteoarthritis; painful joints; Knee Pain; groin lump; and arm lump (both)   HPI Brian Chambers presents for a follow-up of arthralgias for which she was seen at his last office visit. Labs returned with elevated ANA of 1:640, speckled pattern, elevated ESR of 61, positive SM/RNP.  He informs me he has had pain in the joints of his hands for several years but in the last few months noticed pain in his knees and elbows. Of note he has a sister with systemic lupus erythematosus. Currently taking Tylenol for pain.  Also complains of weight loss of about 20 pounds over the last 4-5 months; he continues to smoke. Has also noticed some swelling in his groin.  History reviewed. No pertinent past medical history.  History reviewed. No pertinent surgical history.  No Known Allergies   Outpatient Medications Prior to Visit  Medication Sig Dispense Refill  . HYDROcodone-acetaminophen (NORCO) 5-325 MG per tablet Take 1 tablet by mouth every 6 (six) hours as needed for pain. 30 tablet 0  . ibuprofen (ADVIL,MOTRIN) 600 MG tablet Take 1 tablet (600 mg total) by mouth every 6 (six) hours as needed. 30 tablet 0  . traMADol (ULTRAM) 50 MG tablet Take 1 tablet (50 mg total) by mouth every 6 (six) hours as needed. 15 tablet 0  . predniSONE (DELTASONE) 10 MG tablet Take 5 tab day 1, take 4 tab day 2, take 3 tab day 3, take 2 tab day 4, and take 1 tab day 5 (Patient not taking: Reported on 04/05/2016) 15 tablet 0   No facility-administered medications prior to visit.     ROS Review of Systems  Constitutional: Positive for unexpected weight change. Negative for activity change and appetite change.  HENT: Negative for sinus pressure and sore throat.   Eyes: Negative for visual disturbance.  Respiratory: Negative for cough, chest tightness and shortness of breath.   Cardiovascular: Negative for chest pain  and leg swelling.  Gastrointestinal: Negative for abdominal distention, abdominal pain, constipation and diarrhea.  Endocrine: Negative.   Genitourinary: Negative for dysuria.  Musculoskeletal:       See hpi  Skin: Negative for rash.  Allergic/Immunologic: Negative.   Neurological: Negative for weakness, light-headedness and numbness.  Psychiatric/Behavioral: Negative for dysphoric mood and suicidal ideas.    Objective:  BP 117/74 (BP Location: Right Arm, Patient Position: Sitting, Cuff Size: Large)   Pulse 76   Temp 97.8 F (36.6 C) (Oral)   Ht 6' 1"  (1.854 m)   Wt 129 lb 9.6 oz (58.8 kg)   SpO2 100%   BMI 17.10 kg/m   BP/Weight 04/28/2016 04/05/2016 5/63/1497  Systolic BP 026 378 588  Diastolic BP 74 70 80  Wt. (Lbs) 129.6 130.6 -  BMI 17.1 - -       Physical Exam  Constitutional: He is oriented to person, place, and time. He appears well-developed and well-nourished.  Cardiovascular: Normal rate, normal heart sounds and intact distal pulses.   No murmur heard. Pulmonary/Chest: Effort normal and breath sounds normal. He has no wheezes. He has no rales. He exhibits no tenderness.  Abdominal: Soft. Bowel sounds are normal. He exhibits no distension and no mass. There is no tenderness.  Genitourinary:  Genitourinary Comments: Enlarged inguinal nodes bilaterally  Musculoskeletal:  Edema in MCP, PIP and DIP joints bilaterally; inability to make a fist in both hands and inability to completely  extend the fingers of the hand. Bony nodules in medial aspect of both elbows  Neurological: He is alert and oriented to person, place, and time.     Assessment & Plan:   1. Arthralgia Labs are indicative of connective tissue disorder - ???SLE He has been given options for rheumatology evaluation (given he has no medical coverage) and he will be getting in touch with Brian Chambers for this Will see back at next visit to coordinate care - predniSONE (DELTASONE) 5 MG tablet; Take 1 tablet  (5 mg total) by mouth daily with breakfast.  Dispense: 30 tablet; Refill: 0 - traMADol (ULTRAM) 50 MG tablet; Take 1 tablet (50 mg total) by mouth every 12 (twelve) hours as needed.  Dispense: 60 tablet; Refill: 0  2. Elevated antinuclear antibody (ANA) level Will need to exclude rheumatoid arthritis - Cyclic Citrul Peptide Antibody, IGG - Rheumatoid factor  3. Pelvic lymphadenopathy Unknown etiology Inflammatory vs infective vs malignancy - CT Angio Abd/Pel w/ and/or w/o; Future   Meds ordered this encounter  Medications  . predniSONE (DELTASONE) 5 MG tablet    Sig: Take 1 tablet (5 mg total) by mouth daily with breakfast.    Dispense:  30 tablet    Refill:  0  . traMADol (ULTRAM) 50 MG tablet    Sig: Take 1 tablet (50 mg total) by mouth every 12 (twelve) hours as needed.    Dispense:  60 tablet    Refill:  0    Follow-up: Return in about 1 month (around 05/28/2016) for follow up on arthralgias.   Arnoldo Morale MD

## 2016-04-28 NOTE — Progress Notes (Signed)
Medication refill

## 2016-05-01 LAB — CYCLIC CITRUL PEPTIDE ANTIBODY, IGG

## 2016-05-01 MED FILL — ?PREDNISONE 5 MG TABLET: 5 MG | 30 days supply | Qty: 30 | Fill #0

## 2016-05-03 ENCOUNTER — Telehealth: Payer: Self-pay

## 2016-05-03 NOTE — Telephone Encounter (Signed)
Writer called patient per Dr. Jarold Song to discuss lab results.  Patient stated understanding and will call to schedule a f/u around 05/28/16.

## 2016-05-03 NOTE — Telephone Encounter (Signed)
-----   Message from Arnoldo Morale, MD sent at 05/01/2016  1:24 PM EDT ----- Labs are negative for rheumatoid arthritis and are more in keeping with lupus

## 2016-05-09 ENCOUNTER — Ambulatory Visit (HOSPITAL_COMMUNITY): Payer: Self-pay

## 2016-05-11 MED FILL — traMADol HCL 50 MG TABS: 50 | 30 days supply | Qty: 60 | Fill #0

## 2016-05-12 ENCOUNTER — Ambulatory Visit: Payer: Self-pay

## 2016-05-16 ENCOUNTER — Encounter (HOSPITAL_COMMUNITY): Payer: Self-pay

## 2016-05-16 ENCOUNTER — Ambulatory Visit (HOSPITAL_COMMUNITY)
Admission: RE | Admit: 2016-05-16 | Discharge: 2016-05-16 | Disposition: A | Discharge status: Home / Self Care | Payer: Self-pay | Source: Ambulatory Visit | Attending: Family Medicine | Admitting: Family Medicine

## 2016-05-16 DIAGNOSIS — R1030 Lower abdominal pain, unspecified: Secondary | ICD-10-CM | POA: Insufficient documentation

## 2016-05-16 DIAGNOSIS — R590 Localized enlarged lymph nodes: Secondary | ICD-10-CM | POA: Insufficient documentation

## 2016-05-16 DIAGNOSIS — R932 Abnormal findings on diagnostic imaging of liver and biliary tract: Secondary | ICD-10-CM | POA: Insufficient documentation

## 2016-05-16 DIAGNOSIS — R634 Abnormal weight loss: Secondary | ICD-10-CM | POA: Insufficient documentation

## 2016-05-16 DIAGNOSIS — M5137 Other intervertebral disc degeneration, lumbosacral region: Secondary | ICD-10-CM | POA: Insufficient documentation

## 2016-05-16 MED ORDER — IOPAMIDOL (ISOVUE-300) INJECTION 61%
INTRAVENOUS | Status: AC
  Administered 2016-05-16: 100 mL
  Filled 2016-05-16: qty 100

## 2016-05-22 ENCOUNTER — Telehealth: Payer: Self-pay

## 2016-05-22 NOTE — Telephone Encounter (Signed)
-----   Message from Arnoldo Morale, MD sent at 05/19/2016 10:04 AM EDT ----- No findings on abdominal CT to explain lower abdominal pain. He has small lesions on the liver suspicious for dilated blood vessels; this will be discussed further at his next visit.

## 2016-05-22 NOTE — Telephone Encounter (Signed)
Writer called patient per Dr Jarold Song regarding CT abdomen results.  Patient wil call back to schedule an appt to discuss further.

## 2016-05-29 ENCOUNTER — Ambulatory Visit: Payer: Self-pay

## 2016-06-05 ENCOUNTER — Encounter: Payer: Self-pay | Admitting: Family Medicine

## 2016-06-05 ENCOUNTER — Ambulatory Visit: Payer: Self-pay | Attending: Family Medicine | Admitting: Family Medicine

## 2016-06-05 DIAGNOSIS — Z8489 Family history of other specified conditions: Secondary | ICD-10-CM | POA: Insufficient documentation

## 2016-06-05 DIAGNOSIS — Z7952 Long term (current) use of systemic steroids: Secondary | ICD-10-CM | POA: Insufficient documentation

## 2016-06-05 DIAGNOSIS — M2550 Pain in unspecified joint: Secondary | ICD-10-CM

## 2016-06-05 DIAGNOSIS — R760 Raised antibody titer: Secondary | ICD-10-CM | POA: Insufficient documentation

## 2016-06-05 DIAGNOSIS — D1800 Hemangioma unspecified site: Secondary | ICD-10-CM

## 2016-06-05 DIAGNOSIS — D1803 Hemangioma of intra-abdominal structures: Secondary | ICD-10-CM | POA: Insufficient documentation

## 2016-06-05 DIAGNOSIS — Z23 Encounter for immunization: Secondary | ICD-10-CM

## 2016-06-05 MED ORDER — PREDNISONE 5 MG PO TABS: 5.0000 mg | ORAL_TABLET | Freq: Every day | ORAL | 0 refills | Status: AC

## 2016-06-05 NOTE — Progress Notes (Signed)
Subjective:  Patient ID: Brian Chambers, male    DOB: 06-13-1976  Age: 40 y.o. MRN: [id number]  CC: Results (CT) and Osteoarthritis   HPI Brian Chambers is a 40 year old male with a history of arthralgias, elevated ANA, speckled pattern, elevated SM/RNP. Lab results suspicious for Lupus and he was referred to rheumatology at [facility]. Family history is positive for Lupus. He informs me today he received a call from [facility] and will need to return the call and schedule an appointment. He needs refills on prednisone.  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   History reviewed. No pertinent past medical history.  History reviewed. No pertinent surgical history.  No Known Allergies   Outpatient Medications Prior to Visit  Medication Sig Dispense Refill  . ibuprofen (ADVIL,MOTRIN) 600 MG tablet Take 1 tablet (600 mg total) by mouth every 6 (six) hours as needed. 30 tablet 0  . traMADol (ULTRAM) 50 MG tablet Take 1 tablet (50 mg total) by mouth every 12 (twelve) hours as needed. 60 tablet 0  . HYDROcodone-acetaminophen (NORCO) 5-325 MG per tablet Take 1 tablet by mouth every 6 (six) hours as needed for pain. 30 tablet 0  . predniSONE (DELTASONE) 5 MG tablet Take 1 tablet (5 mg total) by mouth daily with breakfast. 30 tablet 0   No facility-administered medications prior to visit.     ROS Review of Systems Constitutional: Positive for unexpected weight change. Negative for activity change and appetite change.  HENT: Negative for sinus pressure and sore throat.   Eyes: Negative for visual disturbance.  Respiratory: Negative for cough, chest tightness and shortness of breath.   Cardiovascular: Negative for chest pain and leg swelling.  Gastrointestinal: Negative for abdominal distention, abdominal pain, constipation and diarrhea.  Endocrine: Negative.   Genitourinary: Negative  for dysuria.  Musculoskeletal:       See hpi  Skin: Negative for rash.  Allergic/Immunologic: Negative.   Neurological: Negative for weakness, light-headedness and numbness.  Psychiatric/Behavioral: Negative for dysphoric mood and suicidal ideas.   Objective:  BP 104/68 (BP Location: Right Arm, Patient Position: Sitting, Cuff Size: Large)   Pulse 63   Temp 98.2 F (36.8 C) (Oral)   Ht 6' (1.829 m)   Wt 131 lb (59.4 kg)   SpO2 99%   BMI 17.77 kg/m   BP/Weight 06/05/2016 04/28/2016 [id number]  Systolic BP 123456 [id number]  Diastolic BP 68 74 70  Wt. (Lbs) 131 129.6 130.6  BMI 17.77 17.1 -      Physical Exam Constitutional: He is oriented to person, place, and time. He appears well-developed and well-nourished.  Cardiovascular: Normal rate, normal heart sounds and intact distal pulses.   No murmur heard. Pulmonary/Chest: Effort normal and breath sounds normal. He has no wheezes. He has no rales. He exhibits no tenderness.  Abdominal: Soft. Bowel sounds are normal. He exhibits no distension and no mass. There is no tenderness.  Genitourinary:  Genitourinary Comments: Enlarged inguinal nodes bilaterally  Musculoskeletal:  Edema has improved in MCP, PIP and DIP joints bilaterally; inability to make a fist in both hands and inability to completely extend the fingers of the hand. Bony nodules in medial aspect of both elbows  Neurological: He is alert and oriented to person, place, and time.    CLINICAL DATA:  Bilateral lower quadrant pain for 4-5 months, recent weight loss  EXAM: CT ABDOMEN AND PELVIS WITH  CONTRAST  TECHNIQUE: Multidetector CT imaging of the abdomen and pelvis was performed using the standard protocol following bolus administration of intravenous contrast.  CONTRAST:  142mL ISOVUE-300 IOPAMIDOL (ISOVUE-300) INJECTION 61%  COMPARISON:  None.  FINDINGS: Lower chest: The lung bases are clear.  Hepatobiliary: The liver enhances and there is a small focus  of hyperenhancement posteriorly in the right lobe most consistent with hemangioma. A smaller enhancement is noted posteriorly in the right lobe is well more medially also possibly representing hemangioma. Metastatic disease obviously cannot be excluded in the proper clinical setting. The gallbladder is contracted and no calcified gallstones are seen.  Pancreas: The pancreas is normal in size and the pancreatic duct is not dilated.  Spleen: The spleen is normal in size.  Adrenals/Urinary Tract: The adrenal glands appear normal. The kidneys enhance with no calculus or mass, and there is no evidence of hydronephrosis. The ureters are normal in caliber. The urinary bladder is not well distended but no abnormality is seen.  Stomach/Bowel: The stomach is moderately distended with fluid and oral contrast. No distention of small bowel is seen. The colon is largely decompressed and there is feces throughout the colon. The terminal ileum and the appendix are unremarkable.  Vascular/Lymphatic: The abdominal aorta is unremarkable. No adenopathy is seen.  Reproductive: The prostate is normal in size.  Other: None  Musculoskeletal: The lumbar vertebrae are in normal alignment. There is age advanced degenerative disc disease at L5-S1 with vacuum disc phenomenon is present.  IMPRESSION: 1. No explanation for the patient's lower quadrant pain is seen. 2. Two small foci of increased enhancement in the right lobe of liver posteriorly may simply represent vascular hemangiomas, but metastatic disease cannot be excluded in the proper clinical setting. Correlate clinically. Ultrasound of the liver may be helpful to assess these areas further. 3. The appendix and terminal ileum are unremarkable. 4. Degenerative disc disease at L5-S1.   Electronically Signed   By: Ivar Drape M.D.   On: 05/16/2016 15:25  Assessment & Plan:   1. Arthralgia, unspecified joint Patient missed call  from rheumatology at [facility] and is supposed to call back to reschedule - predniSONE (DELTASONE) 5 MG tablet; Take 1 tablet (5 mg total) by mouth daily with breakfast.  Dispense: 30 tablet; Refill: 0  2. Hemangioma We'll repeat liver ultrasound in 3 months to follow-up on stability   Meds ordered this encounter  Medications  . predniSONE (DELTASONE) 5 MG tablet    Sig: Take 1 tablet (5 mg total) by mouth daily with breakfast.    Dispense:  30 tablet    Refill:  0    Follow-up: Return in about 3 months (around 09/05/2016) for Follow-up on vascular hemangioma and arthralgia.   Arnoldo Morale MD

## 2016-06-05 NOTE — Progress Notes (Signed)
Refills needed

## 2016-06-27 ENCOUNTER — Telehealth: Payer: Self-pay | Admitting: Family Medicine

## 2016-06-27 NOTE — Telephone Encounter (Signed)
Patient called stated that he would like to be seen at [facility] Rhe[facility]ology please process referral.    Please follow up with patient.

## 2016-06-28 NOTE — Telephone Encounter (Signed)
Noted  Pt will be contact by [place] in 48 hours and if he qualifies they will make her an appointment to see the rheumatology I will fax the ov [email or web address]. [phone] fax [phone] address [place] [address] floor [place] waiting

## 2020-08-28 ENCOUNTER — Other Ambulatory Visit: Payer: Medicare Other

## 2020-08-28 ENCOUNTER — Other Ambulatory Visit: Payer: Self-pay

## 2020-08-28 DIAGNOSIS — Z20822 Contact with and (suspected) exposure to covid-19: Secondary | ICD-10-CM

## 2020-08-31 LAB — NOVEL CORONAVIRUS, NAA: SARS-CoV-2, NAA: DETECTED — AB

## 2023-04-13 ENCOUNTER — Other Ambulatory Visit: Payer: Self-pay | Admitting: Nurse Practitioner

## 2023-04-13 DIAGNOSIS — M5431 Sciatica, right side: Secondary | ICD-10-CM

## 2023-04-17 ENCOUNTER — Encounter: Payer: Self-pay | Admitting: Nurse Practitioner

## 2023-04-20 ENCOUNTER — Ambulatory Visit
Admission: RE | Admit: 2023-04-20 | Discharge: 2023-04-20 | Disposition: A | Discharge status: Home / Self Care | Payer: Medicare HMO | Source: Ambulatory Visit | Attending: Nurse Practitioner | Admitting: Nurse Practitioner

## 2023-04-20 DIAGNOSIS — M5431 Sciatica, right side: Secondary | ICD-10-CM

## 2023-04-20 DIAGNOSIS — M5441 Lumbago with sciatica, right side: Secondary | ICD-10-CM | POA: Diagnosis not present

## 2023-05-10 DIAGNOSIS — H6122 Impacted cerumen, left ear: Secondary | ICD-10-CM | POA: Diagnosis not present

## 2023-05-10 DIAGNOSIS — M48061 Spinal stenosis, lumbar region without neurogenic claudication: Secondary | ICD-10-CM | POA: Diagnosis not present

## 2023-05-10 DIAGNOSIS — M47816 Spondylosis without myelopathy or radiculopathy, lumbar region: Secondary | ICD-10-CM | POA: Diagnosis not present

## 2023-05-10 DIAGNOSIS — E785 Hyperlipidemia, unspecified: Secondary | ICD-10-CM | POA: Diagnosis not present

## 2023-05-10 DIAGNOSIS — M0579 Rheumatoid arthritis with rheumatoid factor of multiple sites without organ or systems involvement: Secondary | ICD-10-CM | POA: Diagnosis not present

## 2023-05-10 DIAGNOSIS — H18413 Arcus senilis, bilateral: Secondary | ICD-10-CM | POA: Diagnosis not present

## 2023-05-10 DIAGNOSIS — M5431 Sciatica, right side: Secondary | ICD-10-CM | POA: Diagnosis not present

## 2023-05-10 DIAGNOSIS — H6692 Otitis media, unspecified, left ear: Secondary | ICD-10-CM | POA: Diagnosis not present

## 2023-06-28 DIAGNOSIS — M329 Systemic lupus erythematosus, unspecified: Secondary | ICD-10-CM | POA: Diagnosis not present

## 2023-06-28 DIAGNOSIS — E785 Hyperlipidemia, unspecified: Secondary | ICD-10-CM | POA: Diagnosis not present

## 2023-06-28 DIAGNOSIS — D8481 Immunodeficiency due to conditions classified elsewhere: Secondary | ICD-10-CM | POA: Diagnosis not present

## 2023-06-28 DIAGNOSIS — M069 Rheumatoid arthritis, unspecified: Secondary | ICD-10-CM | POA: Diagnosis not present

## 2023-06-28 DIAGNOSIS — Z008 Encounter for other general examination: Secondary | ICD-10-CM | POA: Diagnosis not present

## 2023-06-28 DIAGNOSIS — M5430 Sciatica, unspecified side: Secondary | ICD-10-CM | POA: Diagnosis not present
